# Patient Record
Sex: Female | Born: 1990 | Hispanic: Yes | Marital: Single
Health system: Southern US, Community
[De-identification: ages and names within clinical notes are randomized; demographics above are authoritative.]

---

## 2013-03-02 ENCOUNTER — Emergency Department: Payer: Self-pay | Admitting: Emergency Medicine

## 2013-03-02 LAB — URINALYSIS, COMPLETE
Bilirubin,UR: NEGATIVE
Blood: NEGATIVE
Ketone: NEGATIVE
Leukocyte Esterase: NEGATIVE
Ph: 5 (ref 4.5–8.0)
Protein: NEGATIVE
RBC,UR: 3 /HPF (ref 0–5)
Specific Gravity: 1.028 (ref 1.003–1.030)
Squamous Epithelial: 6
WBC UR: 6 /HPF (ref 0–5)

## 2013-03-02 LAB — COMPREHENSIVE METABOLIC PANEL
Albumin: 3 g/dL — ABNORMAL LOW (ref 3.4–5.0)
Anion Gap: 3 — ABNORMAL LOW (ref 7–16)
Calcium, Total: 8.7 mg/dL (ref 8.5–10.1)
Chloride: 107 mmol/L (ref 98–107)
Co2: 28 mmol/L (ref 21–32)
Creatinine: 0.51 mg/dL — ABNORMAL LOW (ref 0.60–1.30)
EGFR (African American): >60
EGFR (Non-African Amer.): >60
Glucose: 87 mg/dL (ref 65–99)
Osmolality: 273 (ref 275–301)
SGOT(AST): 14 U/L — ABNORMAL LOW (ref 15–37)
SGPT (ALT): 17 U/L (ref 12–78)
Total Protein: 6.9 g/dL (ref 6.4–8.2)

## 2013-03-02 LAB — CBC
MCH: 28.6 pg (ref 26.0–34.0)
Platelet: 270 10*3/uL (ref 150–440)
RDW: 13.6 % (ref 11.5–14.5)

## 2013-03-02 LAB — HCG, QUANTITATIVE, PREGNANCY: Beta Hcg, Quant.: 8971 m[IU]/mL — ABNORMAL HIGH

## 2013-05-26 ENCOUNTER — Encounter: Payer: Self-pay | Admitting: Maternal and Fetal Medicine

## 2013-07-25 ENCOUNTER — Observation Stay: Payer: Self-pay

## 2013-07-25 LAB — GC/CHLAMYDIA PROBE AMP

## 2013-07-25 LAB — URINALYSIS, COMPLETE
BILIRUBIN, UR: NEGATIVE
BLOOD: NEGATIVE
Bacteria: NONE SEEN
Glucose,UR: NEGATIVE mg/dL (ref 0–75)
Ketone: NEGATIVE
LEUKOCYTE ESTERASE: NEGATIVE
Nitrite: NEGATIVE
PROTEIN: NEGATIVE
Ph: 5 (ref 4.5–8.0)
SPECIFIC GRAVITY: 1.016 (ref 1.003–1.030)
Squamous Epithelial: <1

## 2013-07-29 ENCOUNTER — Observation Stay: Payer: Self-pay | Admitting: Obstetrics and Gynecology

## 2013-08-10 ENCOUNTER — Observation Stay: Payer: Self-pay

## 2013-08-17 ENCOUNTER — Inpatient Hospital Stay: Payer: Self-pay

## 2013-08-17 LAB — CBC WITH DIFFERENTIAL/PLATELET
BASOS ABS: 0 10*3/uL (ref 0.0–0.1)
BASOS PCT: 0.4 %
Eosinophil #: 0 10*3/uL (ref 0.0–0.7)
Eosinophil %: 0.4 %
HCT: 33.4 % — AB (ref 35.0–47.0)
HGB: 11.2 g/dL — AB (ref 12.0–16.0)
Lymphocyte #: 2.2 10*3/uL (ref 1.0–3.6)
Lymphocyte %: 18.3 %
MCH: 26.7 pg (ref 26.0–34.0)
MCHC: 33.5 g/dL (ref 32.0–36.0)
MCV: 80 fL (ref 80–100)
MONOS PCT: 6.7 %
Monocyte #: 0.8 x10 3/mm (ref 0.2–0.9)
Neutrophil #: 8.9 10*3/uL — ABNORMAL HIGH (ref 1.4–6.5)
Neutrophil %: 74.2 %
Platelet: 299 10*3/uL (ref 150–440)
RBC: 4.18 10*6/uL (ref 3.80–5.20)
RDW: 14.5 % (ref 11.5–14.5)
WBC: 12 10*3/uL — ABNORMAL HIGH (ref 3.6–11.0)

## 2013-08-18 LAB — HEMATOCRIT: HCT: 32.1 % — AB (ref 35.0–47.0)

## 2013-08-18 LAB — GC/CHLAMYDIA PROBE AMP

## 2013-10-09 LAB — BETA STREP CULTURE(ARMC)

## 2013-12-10 ENCOUNTER — Emergency Department: Payer: Self-pay | Admitting: Emergency Medicine

## 2013-12-10 LAB — COMPREHENSIVE METABOLIC PANEL
Albumin: 3.7 g/dL (ref 3.4–5.0)
Alkaline Phosphatase: 58 U/L
Anion Gap: 10 (ref 7–16)
BUN: 9 mg/dL (ref 7–18)
Bilirubin,Total: 0.2 mg/dL (ref 0.2–1.0)
Calcium, Total: 8.4 mg/dL — ABNORMAL LOW (ref 8.5–10.1)
Chloride: 110 mmol/L — ABNORMAL HIGH (ref 98–107)
Co2: 23 mmol/L (ref 21–32)
Creatinine: 0.92 mg/dL (ref 0.60–1.30)
EGFR (Non-African Amer.): >60
GLUCOSE: 92 mg/dL (ref 65–99)
Osmolality: 283 (ref 275–301)
Potassium: 3.2 mmol/L — ABNORMAL LOW (ref 3.5–5.1)
SGOT(AST): 29 U/L (ref 15–37)
SGPT (ALT): 60 U/L
SODIUM: 143 mmol/L (ref 136–145)
TOTAL PROTEIN: 7.5 g/dL (ref 6.4–8.2)

## 2013-12-10 LAB — CBC WITH DIFFERENTIAL/PLATELET
Basophil #: 0.1 10*3/uL (ref 0.0–0.1)
Basophil %: 0.7 %
EOS PCT: 0.3 %
Eosinophil #: 0 10*3/uL (ref 0.0–0.7)
HCT: 35.8 % (ref 35.0–47.0)
HGB: 11.8 g/dL — AB (ref 12.0–16.0)
Lymphocyte #: 3.9 10*3/uL — ABNORMAL HIGH (ref 1.0–3.6)
Lymphocyte %: 37.9 %
MCH: 27.9 pg (ref 26.0–34.0)
MCHC: 33.1 g/dL (ref 32.0–36.0)
MCV: 84 fL (ref 80–100)
Monocyte #: 0.6 x10 3/mm (ref 0.2–0.9)
Monocyte %: 6 %
NEUTROS ABS: 5.6 10*3/uL (ref 1.4–6.5)
NEUTROS PCT: 55.1 %
Platelet: 321 10*3/uL (ref 150–440)
RBC: 4.25 10*6/uL (ref 3.80–5.20)
RDW: 13.4 % (ref 11.5–14.5)
WBC: 10.2 10*3/uL (ref 3.6–11.0)

## 2013-12-10 LAB — TSH: THYROID STIMULATING HORM: 0.764 u[IU]/mL

## 2013-12-10 LAB — ETHANOL: ETHANOL LVL: 212 mg/dL

## 2013-12-11 LAB — ETHANOL
Ethanol: 138 mg/dL
Ethanol: 33 mg/dL

## 2014-05-09 ENCOUNTER — Ambulatory Visit: Payer: Self-pay | Admitting: Family Medicine

## 2014-05-25 IMAGING — US US OB LIMITED
1 series · 14 of 28 positions shown · non-contrast
Comparison: none

CLINICAL DATA: Abdominal pain.

EXAM:
LIMITED OBSTETRIC ULTRASOUND

[Series 1: us ob limited · 0.26mm/px · 14 of 50 slices shown]
[im 2/50]
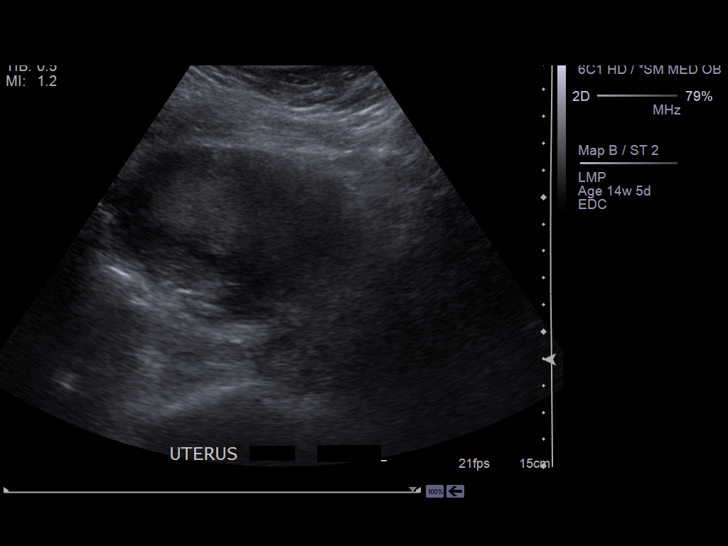
[im 6/50]
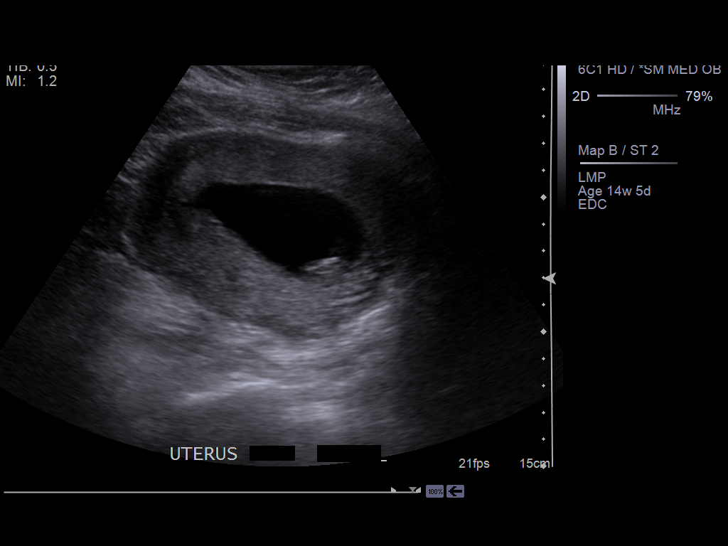
[im 10/50]
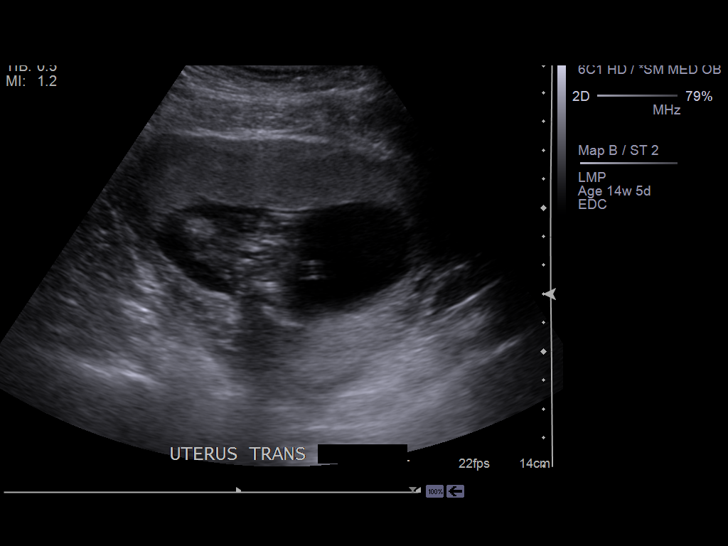
[im 13/50]
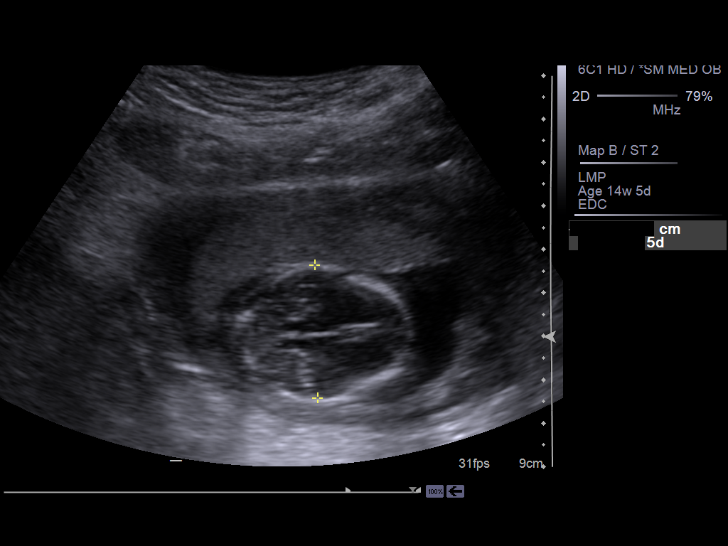
[im 17/50]
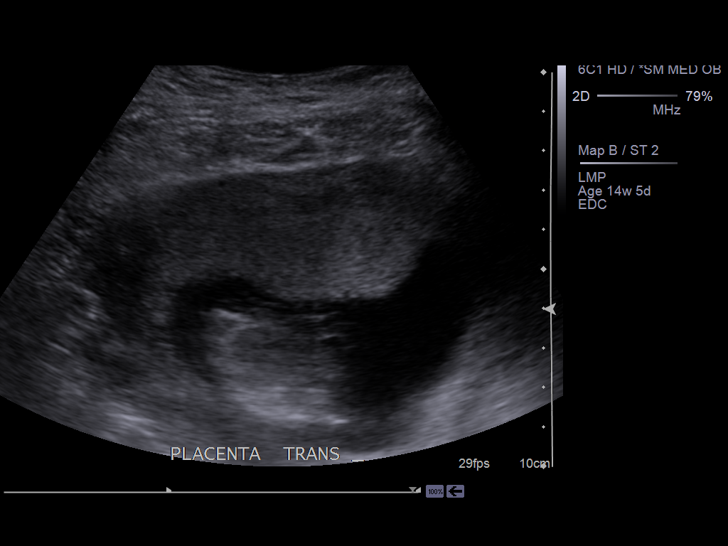
[im 20/50]
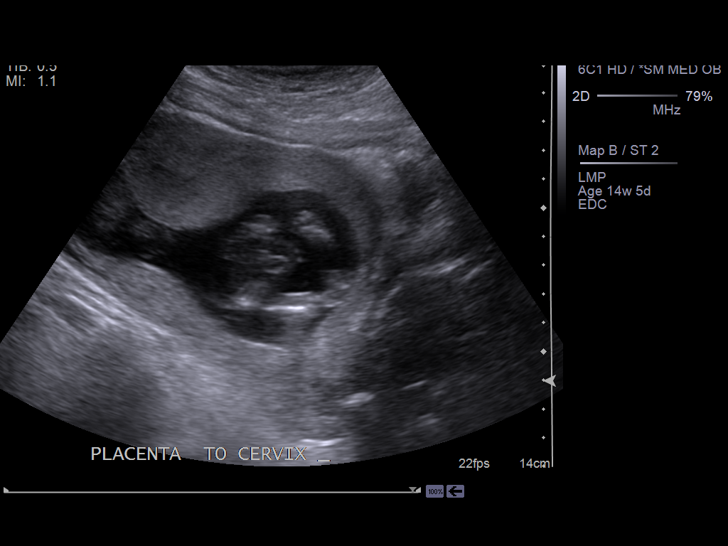
[im 24/50]
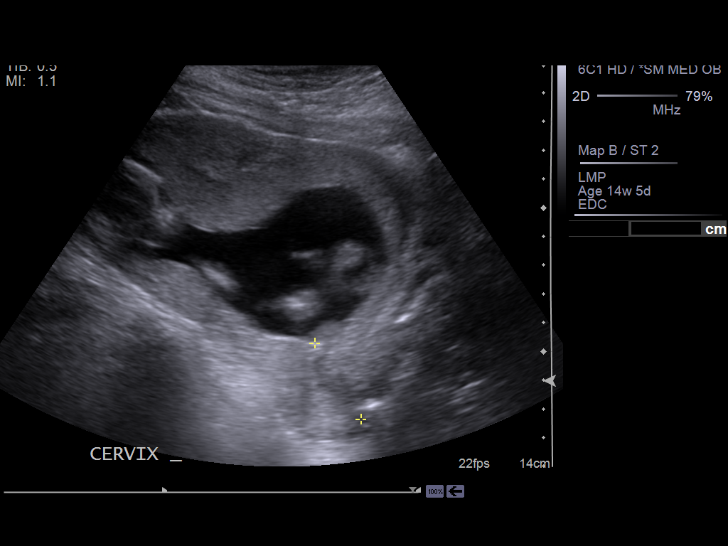
[im 28/50]
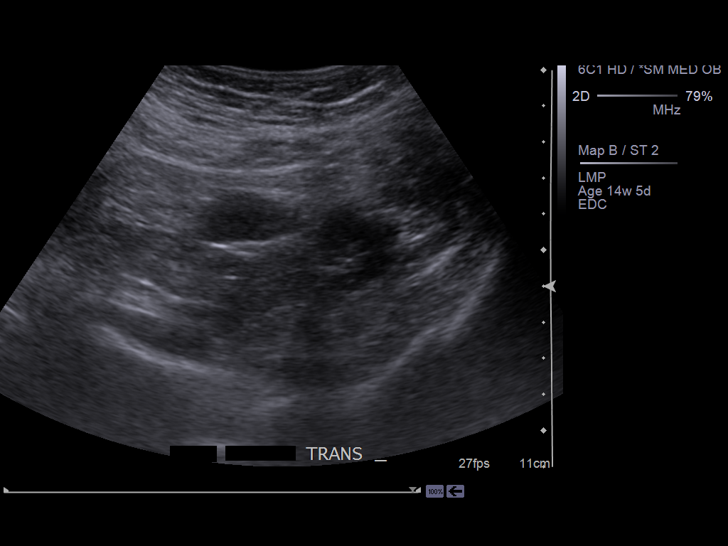
[im 31/50]
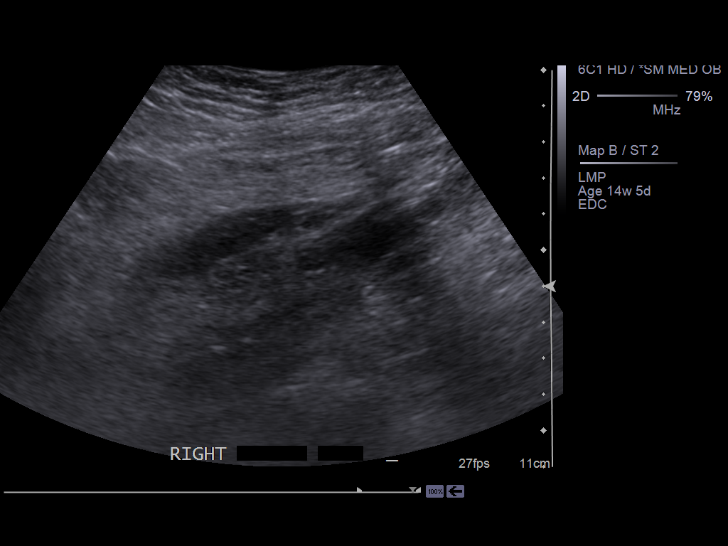
[im 35/50]
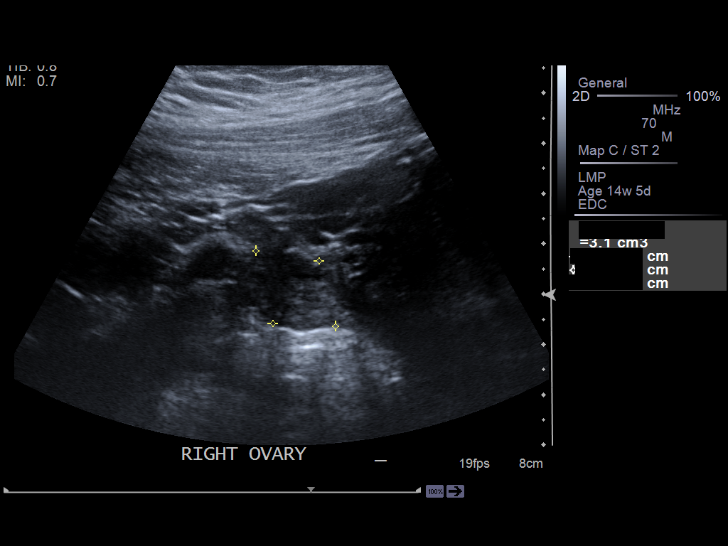
[im 39/50]
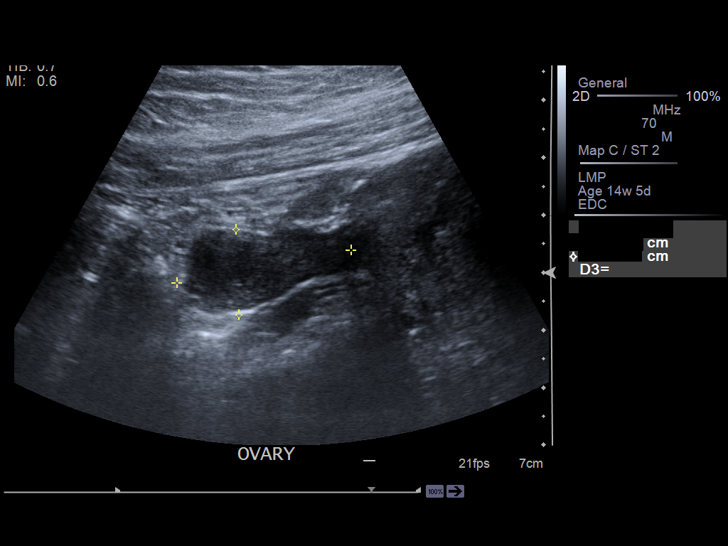
[im 42/50]
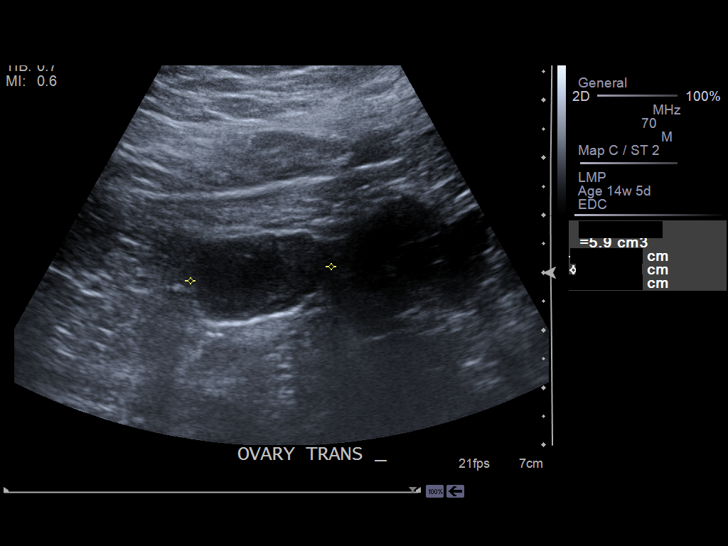
[im 46/50]
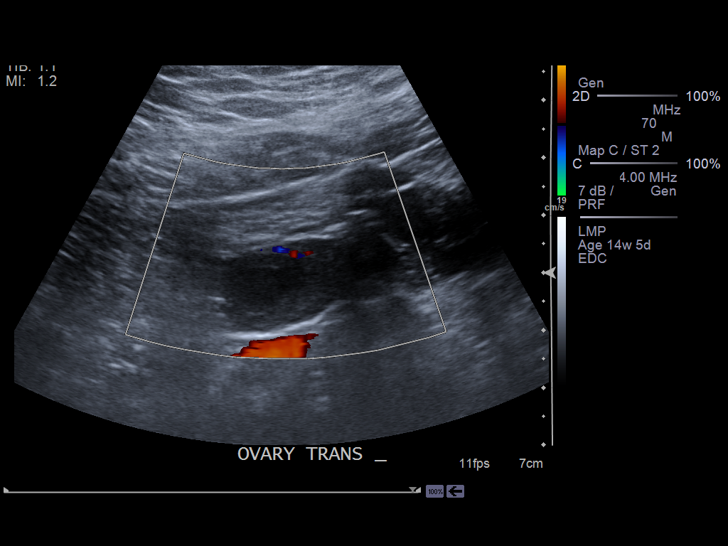
[im 50/50]
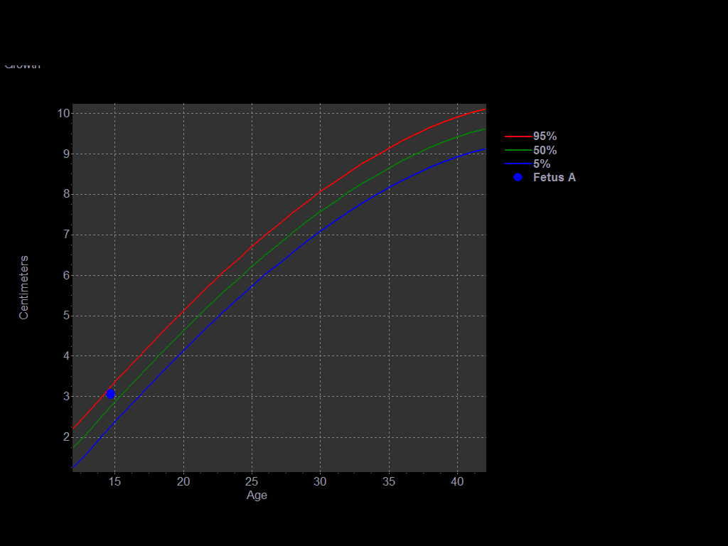

[14 of 28 positions shown; findings below may reference images not displayed]

FINDINGS: Number of Fetuses: 1

Heart Rate:  150 bpm

Movement: Yes

Presentation: Breech

Placental Location: Anterior

Previa: No

Amniotic Fluid (Subjective):  Within normal limits.

BPD:  3.1cm 15w  5d

MATERNAL FINDINGS:

Cervix:  Appears closed.

Uterus/Adnexae: Corpus luteal cyst measuring 1.2 x 0.9 x 1.4 cm
otherwise unremarkable
IMPRESSION: Single viable intrauterine pregnancy.

This exam is performed on an emergent basis and does not
comprehensively evaluate fetal size, dating, or anatomy; follow-up
complete OB US should be considered if further fetal assessment is
warranted.

## 2014-08-15 NOTE — H&P (Signed)
L&D Evaluation:  History:  HPI 24 year old G8 P1152 with EDC=08/26/2013 by a LMP=11/19/2012 (EDC=08/19/2013 by a BPD at 15wk5days) presents at 4035 3/7 weeks from the [facility] with c/o cramping x 2-3 days. Cervical exam at [facility] was 1/95%/-2. Hx is significant for PTD at 34 weeks. Prenatal care began late in pregnancy-mid second trimester and patient was begun on 17P injections, but she has not been keeping weekly appts and has gotten the 17 P sporadically. PNC also remarkable for +Chlamydia treated with Azithromycin. No TOC obtained yet.Recieved TDAp 06/10/2013. LABS : O POS, RI, VI. SVD x2 in 2008 and 2009 delivering 5#8oz and 4# (at 34 weeks) baby girls   Presents with abdominal pain   Patient's Medical History No Chronic Illness   Patient's Surgical History Abdominoplasty, EAB x 5   Medications Pre Natal Vitamins   Allergies NKDA   Social History none   Family History Non-Contributory   ROS:  ROS No dysuria, diarrhea, vaginal bleeding, LOF.  Did have one episode of N/V last night. +white discharge   Exam:  Vital Signs stable   General no apparent distress   Mental Status clear   Chest clear   Heart normal sinus rhythm, no murmur/gallop/rubs   Abdomen gravid, non-tender   Estimated Fetal Weight Small for gestational age   Fetal Position cephalic   Edema no edema   Reflexes 2+   Pelvic no external lesions, 1.5/60%/-1. wet prep negative   Mebranes Intact   FHT normal rate with no decels, 150 baseline with accels to 170s-180   FHT Description Cat 1   Ucx q2-4 min apart   Skin dry   Impression:  Impression reactive NST, IUP at 35.3 weeks with threatened PTL. No cx change since arrival.   Plan:  Plan EFM/NST, monitor contractions and for cervical change, Po hydration has not stopped contractions.  terbutaline 0.25 mgm subcut x1. Aptima and GBS cultures done   Electronic Signatures: Trinna BalloonGutierrez, Clarie Camey L (CNM)  (Signed 20-Apr-15 19:44)  Authored: L&D  Evaluation   Last Updated: 20-Apr-15 19:44 by Trinna BalloonGutierrez, Randle Shatzer L (CNM)

## 2014-08-15 NOTE — H&P (Signed)
L&D Evaluation:  History:  HPI 24 year old [id number] at6362w0d by 15wk US derived EDC=08/26/2013 presenting with decreased fetal movement, and leakage of fluid approximately 2-hrs prior to presentation, no leakage since.  Irregular contractions not particularly painfull but she can feel them, no vaginal bleeding.    Recieved TDAp 06/10/2013. LABS : O POS, RI, VI. SVD x2 in 2008 and 2009 delivering 5#8oz and 4# (at 34 weeks) baby girls   Presents with abdominal pain   Patient's Medical History No Chronic Illness   Patient's Surgical History Abdominoplasty, EAB x 5   Medications Pre Natal Vitamins   Allergies other   Social History none   Family History Non-Contributory   ROS:  ROS No dysuria, diarrhea, vaginal bleeding, LOF.  Did have one episode of N/V last night. +white discharge   Exam:  Vital Signs stable   General no apparent distress   Mental Status clear   Chest no increased work of breathing   Abdomen gravid, non-tender   Estimated Fetal Weight Average for gestational age   Fetal Position cephalic   Back no CVAT   Edema no edema   Reflexes 2+   Pelvic no external lesions, 1.5/50/-3 unchanged from 07/25/13   Mebranes Intact, negative nitrazine   FHT normal rate with no decels, 145-150. moderate variability, positive accels, no decels   Ucx q2-4 min   Skin dry   Impression:  Impression reactive NST, IUP at 6962w0d with decreased fetal movement, r/o ROM, and irregular contractions   Plan:  Comments 1) R/O PTL - no cervical change since monday, lives 10 minutes away.  Discussed recheck in 1-hr vs home, patient opts to go home  2) R/O ROM - no evidence of ROM  3) Decreased fetal movement - category I tracing  4) Disposition - discharge home with follow up at [facility] on 4/27   Electronic Signatures: Lorrene ReidStaebler, Sharunda Salmon M (MD)  (Signed 24-Apr-15 23:49)  Authored: L&D Evaluation   Last Updated: 24-Apr-15 23:49 by Lorrene ReidStaebler, Mitchael Luckey M (MD)

## 2014-08-15 NOTE — H&P (Signed)
L&D Evaluation:  History Expanded:  HPI 24 year old [id number] at1155w5d by 15wk US derived EDC=08/26/2013 presenting with regular contractions since 0000, worsening over a couple of hours and so came in to Labor and Delivery for labor evaluation. Patient has no ROM and no vaginal bleeding.PNC at [facility] and complicated by h/o Preterm Labor in past pregnancy. Patient had intermittant use of 17-P this pregnancy.   Recieved TDAp 06/10/2013. LABS : Group B Beta Strep. Neg, O POS, RI, VI. SVD x2 in 2008 and 2009 delivering 5#8oz and 4# (at 34 weeks) baby girls.   Blood Type (Maternal) O positive   Group B Strep Results Maternal (Result >5wks must be treated as unknown) negative   Maternal Varicella Immune   Rubella Results (Maternal) immune   Maternal [facility]Dap Immune   [facility]   Presents with contractions   Patient's Medical History No Chronic Illness   Patient's Surgical History Abdominoplasty, EAB x 5   Medications Pre Natal Vitamins   Allergies NKDA   Social History none   Family History Non-Contributory   ROS:  ROS All systems were reviewed.  HEENT, CNS, GI, GU, Respiratory, CV, Renal and Musculoskeletal systems were found to be normal.   Exam:  Vital Signs stable   General no apparent distress   Mental Status clear   Chest no increased work of breathing   Heart normal sinus rhythm   Abdomen gravid, non-tender   Estimated Fetal Weight Average for gestational age   Fetal Position cephalic   Back no CVAT   Edema no edema   Reflexes 1+   Pelvic no external lesions, 6/80/-2   Mebranes Ruptured, Spontaneous rupture of membranes at 0359 at time of epidural   Description clear   FHT normal rate with no decels, 145-150. moderate variability, positive accels, no decels   Ucx regular, q2-4 min   Skin dry   Impression:  Impression active labor, reactive NST   Plan:  Plan EFM/NST, monitor contractions and for cervical change   Comments 1)Labor management    2)Epidural  3)Fetal Well-being Reassuring - category I tracing  4) plans Nexplanon   Electronic Signatures: Letitia LibraHarris, Robert Paul (MD)  (Signed 13-May-15 04:55)  Authored: L&D Evaluation   Last Updated: 13-May-15 04:55 by Letitia LibraHarris, Robert Paul (MD)

## 2014-08-18 IMAGING — US US OB DETAIL+14 WK - NRPT [facility]
1 series · 14 of 28 positions shown · non-contrast
Comparison: none

[Series 1: us ob detail+14 wk - nrpt [facility] · 0.25mm/px · 14 of 164 slices shown]
[im 7/164]
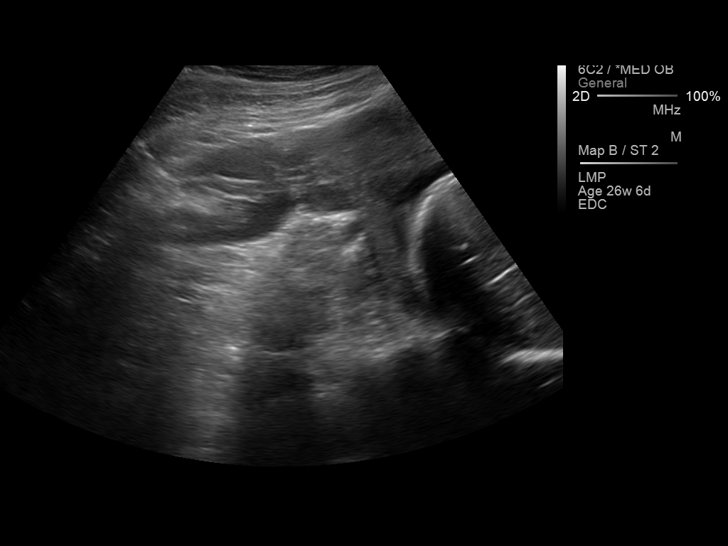
[im 19/164]
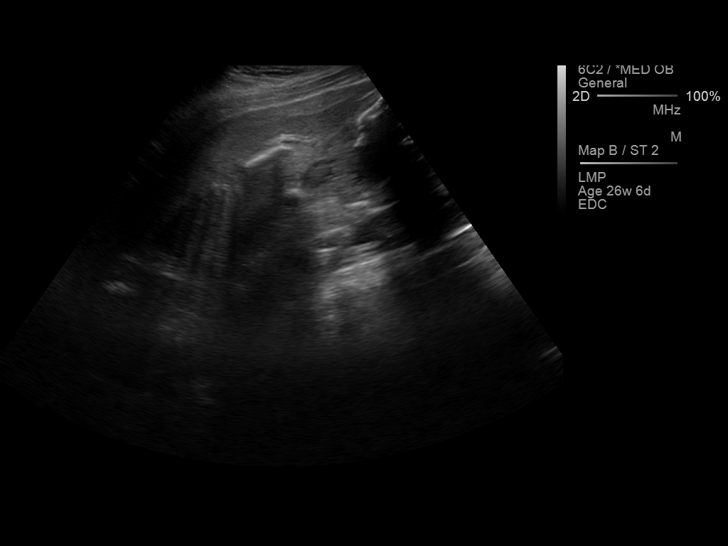
[im 31/164]
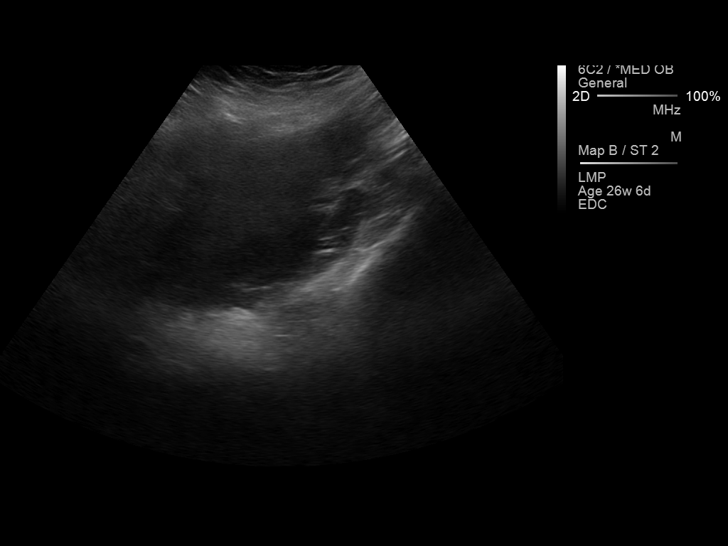
[im 43/164]
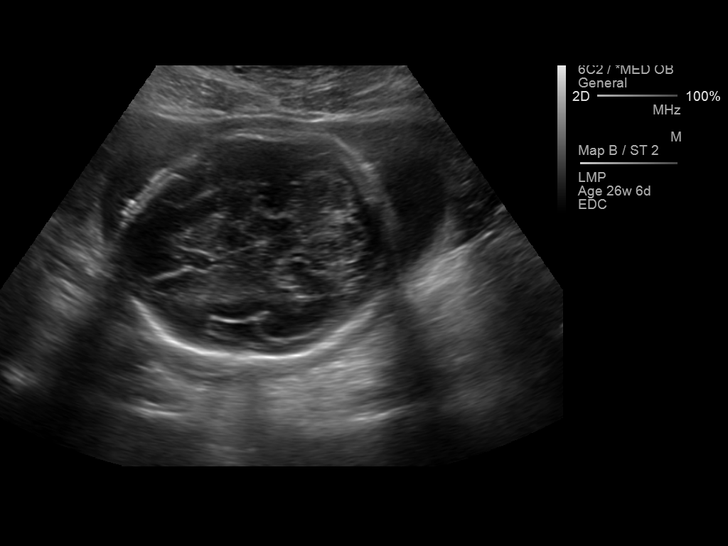
[im 55/164]
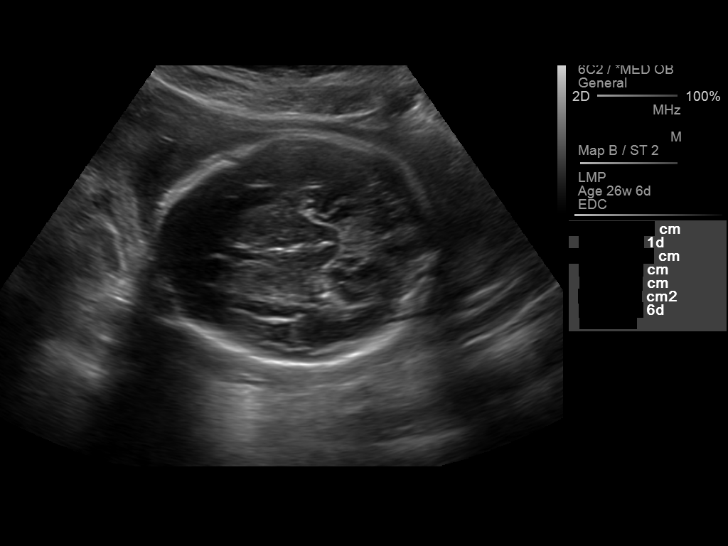
[im 67/164]
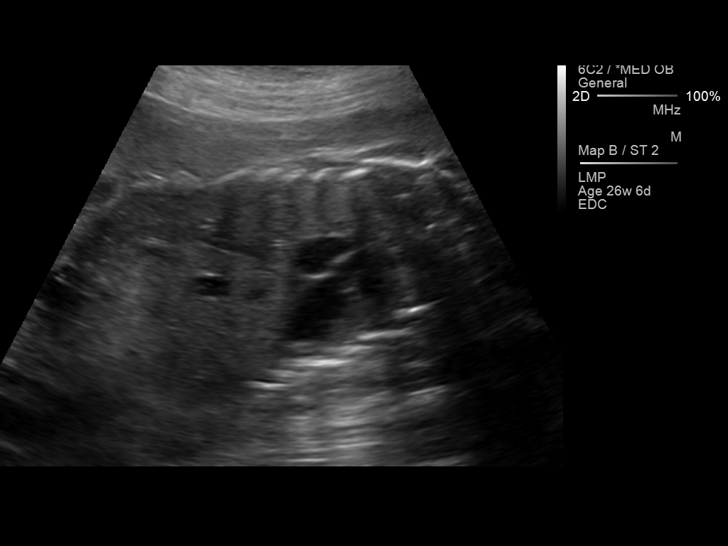
[im 79/164]
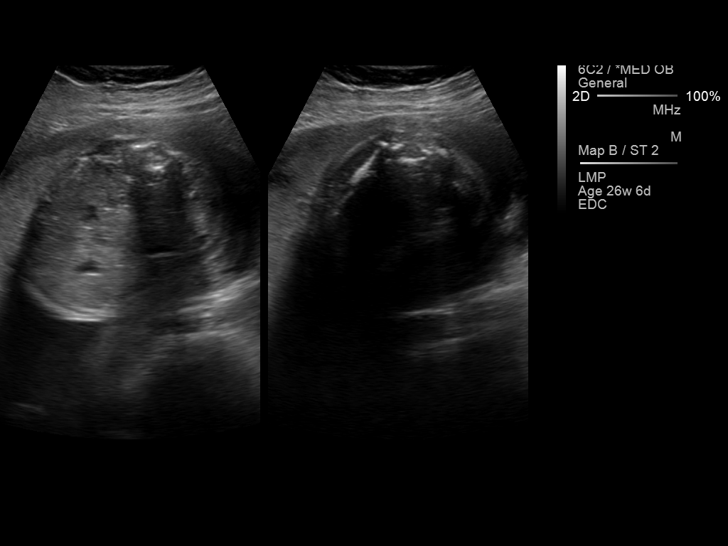
[im 91/164]
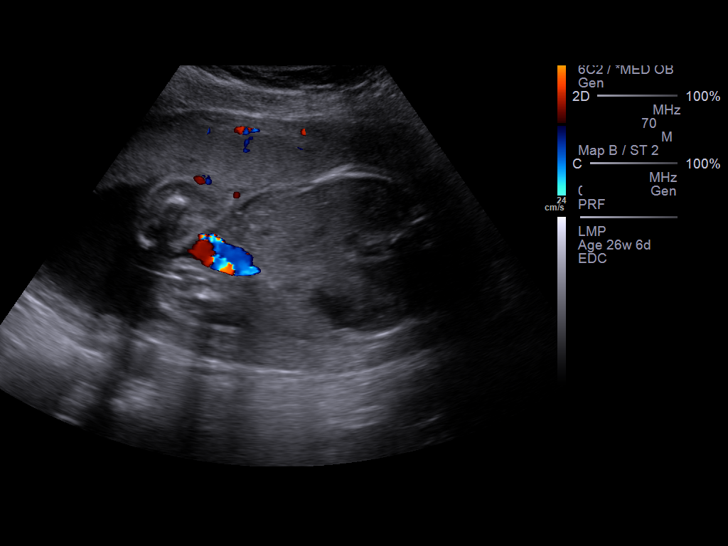
[im 103/164]
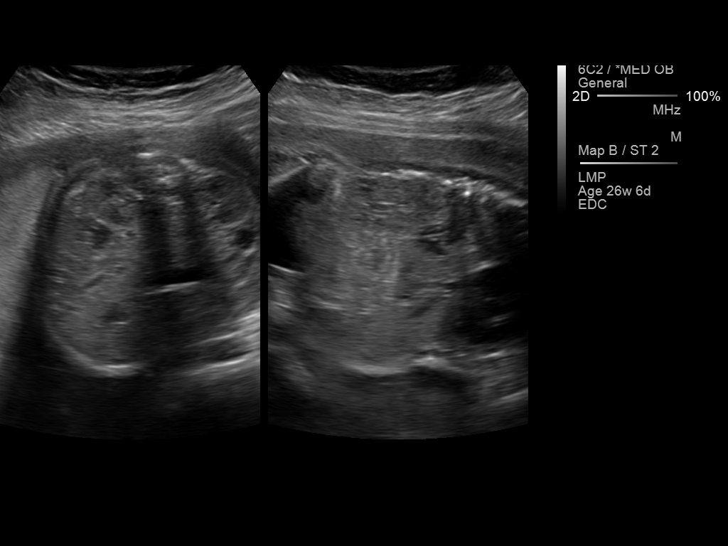
[im 115/164]
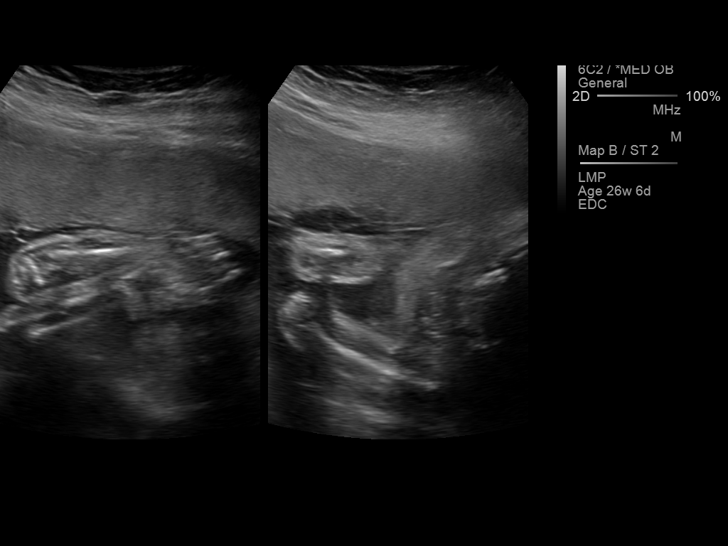
[im 127/164]
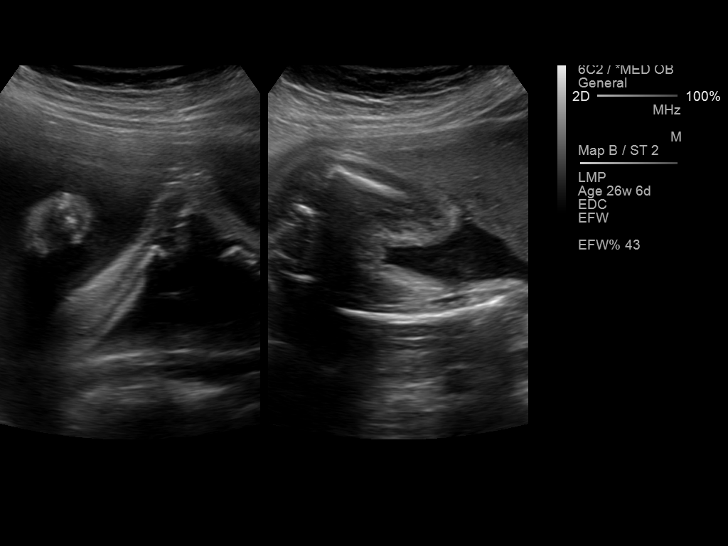
[im 139/164]
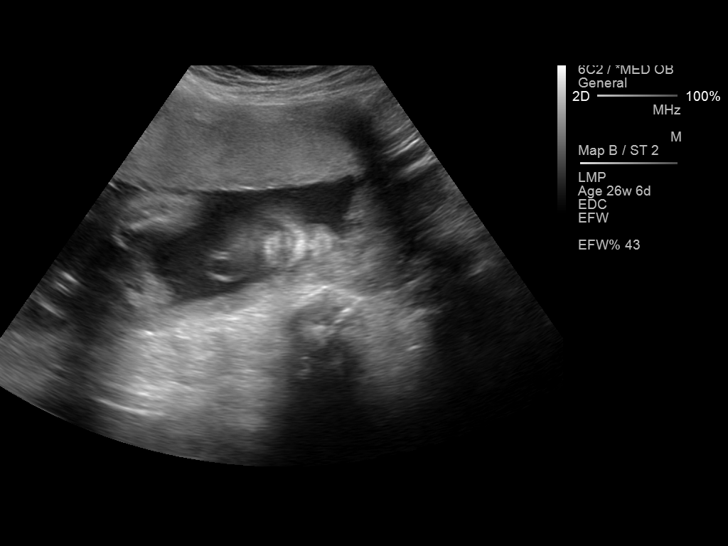
[im 151/164]
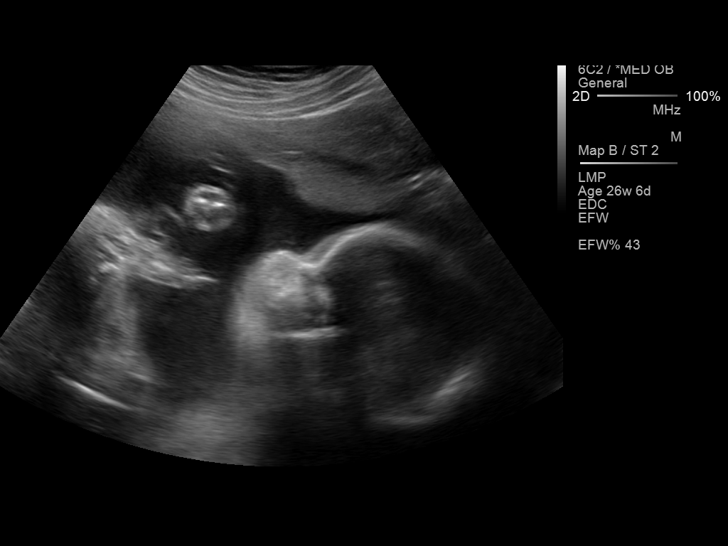
[im 164/164]
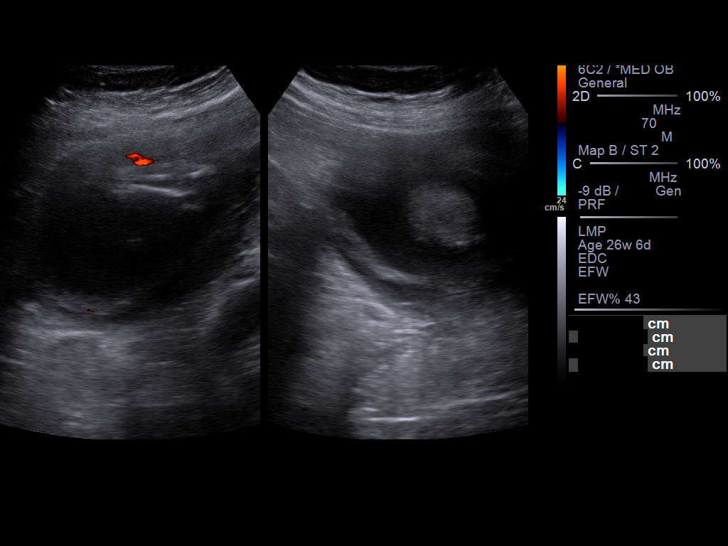

[14 of 28 positions shown; findings below may reference images not displayed]

IMAGES IMPORTED FROM THE [employer] WORKFLOW SYSTEM
NO DICTATION FOR STUDY
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
